# Patient Record
Sex: Male | Born: 1940 | Race: White | Hispanic: No | Smoking: Never smoker
Health system: Southern US, Community
[De-identification: ages and names within clinical notes are randomized; demographics above are authoritative.]

## PROBLEM LIST (undated history)

## (undated) DIAGNOSIS — G459 Transient cerebral ischemic attack, unspecified: Secondary | ICD-10-CM

## (undated) DIAGNOSIS — N200 Calculus of kidney: Secondary | ICD-10-CM

## (undated) DIAGNOSIS — M545 Low back pain, unspecified: Secondary | ICD-10-CM

## (undated) DIAGNOSIS — E7800 Pure hypercholesterolemia, unspecified: Secondary | ICD-10-CM

## (undated) DIAGNOSIS — I4510 Unspecified right bundle-branch block: Secondary | ICD-10-CM

## (undated) DIAGNOSIS — E119 Type 2 diabetes mellitus without complications: Secondary | ICD-10-CM

## (undated) DIAGNOSIS — I2510 Atherosclerotic heart disease of native coronary artery without angina pectoris: Secondary | ICD-10-CM

## (undated) DIAGNOSIS — I10 Essential (primary) hypertension: Secondary | ICD-10-CM

## (undated) DIAGNOSIS — R011 Cardiac murmur, unspecified: Secondary | ICD-10-CM

## (undated) DIAGNOSIS — G8929 Other chronic pain: Secondary | ICD-10-CM

## (undated) HISTORY — PX: JOINT REPLACEMENT: SHX530

## (undated) HISTORY — PX: PARATHYROIDECTOMY: SHX19

## (undated) HISTORY — PX: BACK SURGERY: SHX140

## (undated) HISTORY — PX: CORONARY ANGIOPLASTY WITH STENT PLACEMENT: SHX49

## (undated) HISTORY — PX: CARDIAC CATHETERIZATION: SHX172

## (undated) HISTORY — PX: SHOULDER OPEN ROTATOR CUFF REPAIR: SHX2407

## (undated) HISTORY — PX: POSTERIOR FUSION LUMBAR SPINE: SUR632

## (undated) HISTORY — PX: LITHOTRIPSY: SUR834

## (undated) HISTORY — PX: THYROIDECTOMY, PARTIAL: SHX18

## (undated) HISTORY — PX: LUMBAR DISC SURGERY: SHX700

## (undated) HISTORY — PX: EXCISIONAL HEMORRHOIDECTOMY: SHX1541

---

## 2014-04-30 ENCOUNTER — Encounter (HOSPITAL_COMMUNITY)
Admission: RE | Admit: 2014-04-30 | Discharge: 2014-04-30 | Disposition: A | Discharge status: Home / Self Care | Payer: Worker's Compensation | Source: Ambulatory Visit | Attending: Orthopedic Surgery | Admitting: Orthopedic Surgery

## 2014-04-30 ENCOUNTER — Encounter (HOSPITAL_COMMUNITY): Payer: Self-pay

## 2014-04-30 DIAGNOSIS — E119 Type 2 diabetes mellitus without complications: Secondary | ICD-10-CM | POA: Diagnosis not present

## 2014-04-30 DIAGNOSIS — Z01812 Encounter for preprocedural laboratory examination: Secondary | ICD-10-CM | POA: Insufficient documentation

## 2014-04-30 DIAGNOSIS — I2510 Atherosclerotic heart disease of native coronary artery without angina pectoris: Secondary | ICD-10-CM | POA: Diagnosis not present

## 2014-04-30 DIAGNOSIS — Z955 Presence of coronary angioplasty implant and graft: Secondary | ICD-10-CM | POA: Insufficient documentation

## 2014-04-30 HISTORY — DX: Unspecified right bundle-branch block: I45.10

## 2014-04-30 HISTORY — DX: Cardiac murmur, unspecified: R01.1

## 2014-04-30 HISTORY — DX: Type 2 diabetes mellitus without complications: E11.9

## 2014-04-30 HISTORY — DX: Essential (primary) hypertension: I10

## 2014-04-30 HISTORY — DX: Atherosclerotic heart disease of native coronary artery without angina pectoris: I25.10

## 2014-04-30 HISTORY — DX: Transient cerebral ischemic attack, unspecified: G45.9

## 2014-04-30 LAB — BASIC METABOLIC PANEL
Anion gap: 5 (ref 5–15)
BUN: 12 mg/dL (ref 6–23)
CO2: 28 mmol/L (ref 19–32)
Calcium: 9.1 mg/dL (ref 8.4–10.5)
Chloride: 104 mmol/L (ref 96–112)
Creatinine, Ser: 0.98 mg/dL (ref 0.50–1.35)
GFR calc Af Amer: >90 mL/min (ref >90)
GFR calc non Af Amer: 79 mL/min — ABNORMAL LOW (ref >90)
Glucose, Bld: 185 mg/dL — ABNORMAL HIGH (ref 70–99)
Potassium: 3.7 mmol/L (ref 3.5–5.1)
Sodium: 137 mmol/L (ref 135–145)

## 2014-04-30 LAB — SURGICAL PCR SCREEN
MRSA, PCR: NEGATIVE
Staphylococcus aureus: NEGATIVE

## 2014-04-30 LAB — CBC
HCT: 42.6 % (ref 39.0–52.0)
Hemoglobin: 14.4 g/dL (ref 13.0–17.0)
MCH: 30.8 pg (ref 26.0–34.0)
MCHC: 33.8 g/dL (ref 30.0–36.0)
MCV: 91 fL (ref 78.0–100.0)
Platelets: 229 10*3/uL (ref 150–400)
RBC: 4.68 MIL/uL (ref 4.22–5.81)
RDW: 13 % (ref 11.5–15.5)
WBC: 8.9 10*3/uL (ref 4.0–10.5)

## 2014-04-30 NOTE — Progress Notes (Signed)
   04/30/14 1537  OBSTRUCTIVE SLEEP APNEA  Have you ever been diagnosed with sleep apnea through a sleep study? No  Do you snore loudly (loud enough to be heard through closed doors)?  0  Do you often feel tired, fatigued, or sleepy during the daytime? 1  Has anyone observed you stop breathing during your sleep? 0  Do you have, or are you being treated for high blood pressure? 1  BMI more than 35 kg/m2? 0  Age over 592 years old? 1  Neck circumference greater than 40 cm/16 inches? 1  Gender: 1  Obstructive Sleep Apnea Score 5

## 2014-04-30 NOTE — Progress Notes (Signed)
Patient denies CP, shob. Dr. Magdalene PatriciaStugart, Caleb Burton is PCP

## 2014-04-30 NOTE — Progress Notes (Signed)
[facility] with [facility] requesting [facility]. Pulled last cardiac OV from Care Everywhere and placed printed copy in chart. Requested EKG image and Echo from Dr. Alene MiresSt. Clair.

## 2014-04-30 NOTE — Pre-Procedure Instructions (Addendum)
Kaleem Sartwell  04/30/2014   Your procedure is scheduled on:  April 8  Report to [facility] Admitting at 12:00 PM.  Call this number if you have problems the morning of surgery: [phone]   Remember:   Do not eat food or drink liquids after midnight.   Take these medicines the morning of surgery with A SIP OF WATER: Metoprolol,    Take 25 units of Lantus the night before surgery instead of the normal dose.   STOP Plavix, Aleve, Aspirin, Fish Oil starting .  Place clean  sheets on your bed the night of your first shower and do not sleep with pets.  Day of Surgery  Do not apply any lotions the morning of surgery.  Please wear clean clothes to the hospital/surgery center.     Please read over the following fact sheets that you were given: Pain Booklet, Coughing and Deep Breathing and Surgical Site Infection Prevention

## 2014-05-01 NOTE — Progress Notes (Signed)
Anesthesia Chart Review:  Patient is a 74 year old male scheduled for left reverse shoulder on 05/10/14 by Dr. Ranell PatrickNorris.  History includes non-smoker, CAD s/p DES to mid LAD 11/11/08, DM2, right BBB, murmur, TIA, HTN, parathyroidectomy and partial thyroidectomy, back surgery. BMI is consistent with mild obesity. PCP is Dr. Simmie Daviesicky Stugart. He was seen by cardiologist Dr. Delsa SaleShannon [place] on 03/12/14. Patient was asymptomatic from a CV standpoint and was exercising > 4 METS.  No further CV testing was recommended prior to this procedure. He was instructed to hold Plavix 5 days prior to surgery.   Meds include ASA, Plavix, Lantus, Lopressor, Fish oil, Zocor.  03/12/14 EKG ([facility]): SB at 54 bpm, LAFB, non-specific ST/T wave abnormality.   12/21/12 Echo ([facility]): Moderate concentric LVH, inferolateral and anterolateral akinesis +/- mild inferior hypokinesis. LVEF is mildly reduced at 50%. Mild grade I/IV diastolic dysfunction; abnormal relaxation pattern of mitral inflow.  Mild AV thickening with mild AR. Mild 1+ MR. Estimation of RVSP is not possible.   11/08/08 Cardiac cath ([facility]; see Care Everywhere): LM free of disease. Medium size LAD. In the proximal mid extent there was a focal 70% lesion in the left anterior descending artery. At this point in the left anterior descending artery, there was a medium-size diagonal, which was 99%/subtotally occluded. Further down the left anterior descending artery there was some mild to moderate 30% disease. The remainder of the left anterior descending artery was free of disease. Diagonal 2, which was small, had a 70% proximal lesion in it. Small to medium size CX with 30-40% proximal mid stenosis. OM1 occluded proximally with left to left collaterals. OM2 occluded proximally. Left to right collaterals filling the distal RCA. Large and dominant RCA that was occluded in its mid segment. Right to right collaterals filling the distal RCA and right PDA. LV normal chamber size.  Posterior basal and inferior wall akinesis. No MR. EF 40%. CT surgery was consulted but felt to be a poor surgical candidate for CABG. He underwent successful stenting of mid LAD (Xience V DES) on 11/11/08. Lesion went from 70% to 0%.   Preoperative labs noted.   If no acute changes then I would anticipate that he could proceed as planned.  Velna Ochsllison Tranice Laduke, PA-C [facility]An[facility]hesiology Phone [phone] 05/01/2014 11:37 AM

## 2014-05-05 NOTE — H&P (Signed)
  Caleb Burton is an 74 y.o. male.    Chief Complaint: left shoulder pain  HPI: Pt is a 74 y.o. male complaining of left shoulder pain for multiple years. Pain had continually increased since the beginning. X-rays in the clinic show end-stage arthritic changes of the left shoulder. Pt has tried various conservative treatments which have failed to alleviate their symptoms, including injections and therapy. Various options are discussed with the patient. Risks, benefits and expectations were discussed with the patient. Patient understand the risks, benefits and expectations and wishes to proceed with surgery.   PCP:  Vianne BullsSTUGART,RICKY H, MD  D/C Plans:  Home   PMH: Past Medical History  Diagnosis Date  . Coronary artery disease   . Type 2 diabetes mellitus   . RBBB (right bundle branch block)   . TIA (transient ischemic attack)     x 2 post surgery  . Hypertension   . Heart murmur   . History of kidney stones     PSH: Past Surgical History  Procedure Laterality Date  . Cardiac catheterization    . Thyroidectomy, partial    . Parathyroidectomy    . Back surgery      discectomy, fusion  . Rotator cuff repair Left   . Lithotripsy    . Coronary angioplasty with stent placement      Social History:  reports that he has never smoked. He has never used smokeless tobacco. He reports that he does not drink alcohol or use illicit drugs.  Allergies:  No Known Allergies  Medications: No current facility-administered medications for this encounter.   Current Outpatient Prescriptions  Medication Sig Dispense Refill  . acetaminophen (TYLENOL) 500 MG tablet Take 500 mg by mouth every 6 (six) hours as needed for mild pain.    Marland Kitchen. aspirin 81 MG tablet Take 81 mg by mouth daily.    . clopidogrel (PLAVIX) 75 MG tablet Take 75 mg by mouth daily.    . furosemide (LASIX) 20 MG tablet Take 20 mg by mouth.    Marland Kitchen. ibuprofen (ADVIL,MOTRIN) 200 MG tablet Take 200 mg by mouth 2 (two) times daily as  needed for moderate pain.    Marland Kitchen. insulin glargine (LANTUS) 100 UNIT/ML injection Inject 50 Units into the skin at bedtime.     . metoprolol tartrate (LOPRESSOR) 25 MG tablet Take 25 mg by mouth 2 (two) times daily.    . Omega-3 Fatty Acids (FISH OIL PO) Take 1 tablet by mouth daily. 360mg     . simvastatin (ZOCOR) 40 MG tablet Take 40 mg by mouth daily.      No results found for this or any previous visit (from the past 48 hour(s)). No results found.  ROS: Pain with rom of the left upper extremity  Physical Exam:  Alert and oriented 74 y.o. male in no acute distress Cranial nerves 2-12 intact Cervical spine: full rom with no tenderness, nv intact distally Chest: active breath sounds bilaterally, no wheeze rhonchi or rales Heart: regular rate and rhythm, no murmur Abd: non tender non distended with active bowel sounds Hip is stable with rom  Left shoulder with moderate loss of rom and strength with ER and IR No rashes or edema  Assessment/Plan Assessment: left rotator cuff insufficiency  Plan: Patient will undergo a left reverse total shoulder [facility]. Risks benefits and expectations were discussed with the patient. Patient understand risks, benefits and expectations and wishes to proceed.

## 2014-05-09 MED ORDER — CHLORHEXIDINE GLUCONATE 4 % EX LIQD
60.0000 mL | Freq: Once | CUTANEOUS | Status: DC
  Filled 2014-05-09: qty 60

## 2014-05-09 MED ORDER — CEFAZOLIN SODIUM-DEXTROSE 2-3 GM-% IV SOLR
2.0000 g | INTRAVENOUS | Status: AC
  Administered 2014-05-10: 2 g via INTRAVENOUS
  Filled 2014-05-09: qty 50

## 2014-05-10 ENCOUNTER — Ambulatory Visit (HOSPITAL_COMMUNITY): Payer: Worker's Compensation | Admitting: Anesthesiology

## 2014-05-10 ENCOUNTER — Inpatient Hospital Stay (HOSPITAL_COMMUNITY): Payer: Worker's Compensation

## 2014-05-10 ENCOUNTER — Inpatient Hospital Stay (HOSPITAL_COMMUNITY)
Admission: RE | Admit: 2014-05-10 | Discharge: 2014-05-12 | DRG: 483 | Disposition: A | Discharge status: Home / Self Care | Payer: Worker's Compensation | Source: Ambulatory Visit | Attending: Orthopedic Surgery | Admitting: Orthopedic Surgery

## 2014-05-10 ENCOUNTER — Ambulatory Visit (HOSPITAL_COMMUNITY): Payer: Worker's Compensation | Admitting: Vascular Surgery

## 2014-05-10 ENCOUNTER — Encounter (HOSPITAL_COMMUNITY)
Admission: RE | Disposition: A | Discharge status: Home / Self Care | Payer: Self-pay | Source: Ambulatory Visit | Attending: Orthopedic Surgery

## 2014-05-10 ENCOUNTER — Encounter (HOSPITAL_COMMUNITY): Payer: Self-pay | Admitting: *Deleted

## 2014-05-10 DIAGNOSIS — Z7982 Long term (current) use of aspirin: Secondary | ICD-10-CM

## 2014-05-10 DIAGNOSIS — Z96619 Presence of unspecified artificial shoulder joint: Secondary | ICD-10-CM

## 2014-05-10 DIAGNOSIS — G8929 Other chronic pain: Secondary | ICD-10-CM | POA: Diagnosis present

## 2014-05-10 DIAGNOSIS — I4510 Unspecified right bundle-branch block: Secondary | ICD-10-CM | POA: Diagnosis present

## 2014-05-10 DIAGNOSIS — M75102 Unspecified rotator cuff tear or rupture of left shoulder, not specified as traumatic: Principal | ICD-10-CM | POA: Diagnosis present

## 2014-05-10 DIAGNOSIS — E892 Postprocedural hypoparathyroidism: Secondary | ICD-10-CM | POA: Diagnosis present

## 2014-05-10 DIAGNOSIS — Z87442 Personal history of urinary calculi: Secondary | ICD-10-CM

## 2014-05-10 DIAGNOSIS — R011 Cardiac murmur, unspecified: Secondary | ICD-10-CM | POA: Diagnosis present

## 2014-05-10 DIAGNOSIS — I10 Essential (primary) hypertension: Secondary | ICD-10-CM | POA: Diagnosis present

## 2014-05-10 DIAGNOSIS — Z955 Presence of coronary angioplasty implant and graft: Secondary | ICD-10-CM | POA: Diagnosis not present

## 2014-05-10 DIAGNOSIS — Z79899 Other long term (current) drug therapy: Secondary | ICD-10-CM

## 2014-05-10 DIAGNOSIS — Z8673 Personal history of transient ischemic attack (TIA), and cerebral infarction without residual deficits: Secondary | ICD-10-CM | POA: Diagnosis not present

## 2014-05-10 DIAGNOSIS — Z7902 Long term (current) use of antithrombotics/antiplatelets: Secondary | ICD-10-CM

## 2014-05-10 DIAGNOSIS — Z981 Arthrodesis status: Secondary | ICD-10-CM

## 2014-05-10 DIAGNOSIS — E119 Type 2 diabetes mellitus without complications: Secondary | ICD-10-CM | POA: Diagnosis present

## 2014-05-10 DIAGNOSIS — I2510 Atherosclerotic heart disease of native coronary artery without angina pectoris: Secondary | ICD-10-CM | POA: Diagnosis present

## 2014-05-10 DIAGNOSIS — Z794 Long term (current) use of insulin: Secondary | ICD-10-CM

## 2014-05-10 DIAGNOSIS — Z96612 Presence of left artificial shoulder joint: Secondary | ICD-10-CM

## 2014-05-10 DIAGNOSIS — E780 Pure hypercholesterolemia: Secondary | ICD-10-CM | POA: Diagnosis present

## 2014-05-10 HISTORY — DX: Low back pain, unspecified: M54.50

## 2014-05-10 HISTORY — DX: Calculus of kidney: N20.0

## 2014-05-10 HISTORY — PX: REVERSE SHOULDER ARTHROPLASTY: SHX5054

## 2014-05-10 HISTORY — PX: REVERSE TOTAL SHOULDER ARTHROPLASTY: SHX2344

## 2014-05-10 HISTORY — DX: Pure hypercholesterolemia, unspecified: E78.00

## 2014-05-10 HISTORY — DX: Other chronic pain: G89.29

## 2014-05-10 HISTORY — DX: Low back pain: M54.5

## 2014-05-10 LAB — ABO/RH: ABO/RH(D): O NEG

## 2014-05-10 LAB — TYPE AND SCREEN
ABO/RH(D): O NEG
Antibody Screen: NEGATIVE

## 2014-05-10 LAB — GLUCOSE, CAPILLARY: GLUCOSE-CAPILLARY: 118 mg/dL — AB (ref 70–99)

## 2014-05-10 SURGERY — ARTHROPLASTY, SHOULDER, TOTAL, REVERSE
Anesthesia: General | Site: Shoulder | Laterality: Left

## 2014-05-10 MED ORDER — METHOCARBAMOL 500 MG PO TABS: 500.0000 mg | ORAL_TABLET | Freq: Three times a day (TID) | ORAL | Status: AC | PRN

## 2014-05-10 MED ORDER — MIDAZOLAM HCL 2 MG/2ML IJ SOLN
INTRAMUSCULAR | Status: AC
  Filled 2014-05-10: qty 2

## 2014-05-10 MED ORDER — SIMVASTATIN 40 MG PO TABS
40.0000 mg | ORAL_TABLET | Freq: Every day | ORAL | Status: DC
  Administered 2014-05-10 – 2014-05-11 (×2): 40 mg via ORAL
  Filled 2014-05-10 (×2): qty 1

## 2014-05-10 MED ORDER — OMEGA-3-ACID ETHYL ESTERS 1 G PO CAPS
2.0000 g | ORAL_CAPSULE | Freq: Every day | ORAL | Status: DC
  Administered 2014-05-10 – 2014-05-12 (×3): 2 g via ORAL
  Filled 2014-05-10 (×4): qty 2

## 2014-05-10 MED ORDER — CLOPIDOGREL BISULFATE 75 MG PO TABS
75.0000 mg | ORAL_TABLET | Freq: Every day | ORAL | Status: DC
  Administered 2014-05-10 – 2014-05-12 (×3): 75 mg via ORAL
  Filled 2014-05-10 (×3): qty 1

## 2014-05-10 MED ORDER — ONDANSETRON HCL 4 MG/2ML IJ SOLN
INTRAMUSCULAR | Status: AC
  Filled 2014-05-10: qty 2

## 2014-05-10 MED ORDER — METOCLOPRAMIDE HCL 5 MG PO TABS
5.0000 mg | ORAL_TABLET | Freq: Three times a day (TID) | ORAL | Status: DC | PRN
Start: 2014-05-10 — End: 2014-05-12

## 2014-05-10 MED ORDER — CEFAZOLIN SODIUM-DEXTROSE 2-3 GM-% IV SOLR
2.0000 g | Freq: Four times a day (QID) | INTRAVENOUS | Status: AC
  Administered 2014-05-10 – 2014-05-11 (×3): 2 g via INTRAVENOUS
  Filled 2014-05-10 (×4): qty 50

## 2014-05-10 MED ORDER — SODIUM CHLORIDE 0.9 % IV SOLN
INTRAVENOUS | Status: DC
  Administered 2014-05-10: 18:00:00 via INTRAVENOUS

## 2014-05-10 MED ORDER — GLYCOPYRROLATE 0.2 MG/ML IJ SOLN
INTRAMUSCULAR | Status: AC
  Filled 2014-05-10: qty 1

## 2014-05-10 MED ORDER — FENTANYL CITRATE 0.05 MG/ML IJ SOLN: 50.0000 ug | INTRAMUSCULAR | Status: DC | PRN

## 2014-05-10 MED ORDER — FENTANYL CITRATE 0.05 MG/ML IJ SOLN
INTRAMUSCULAR | Status: DC | PRN
  Administered 2014-05-10: 50 ug via INTRAVENOUS

## 2014-05-10 MED ORDER — BUPIVACAINE-EPINEPHRINE 0.25% -1:200000 IJ SOLN
INTRAMUSCULAR | Status: DC | PRN
  Administered 2014-05-10: 8 mL

## 2014-05-10 MED ORDER — INSULIN GLARGINE 100 UNIT/ML ~~LOC~~ SOLN
50.0000 [IU] | Freq: Every day | SUBCUTANEOUS | Status: DC
  Administered 2014-05-10 – 2014-05-11 (×2): 50 [IU] via SUBCUTANEOUS
  Filled 2014-05-10 (×4): qty 0.5

## 2014-05-10 MED ORDER — SODIUM CHLORIDE 0.9 % IR SOLN
Status: DC | PRN
Start: 2014-05-10 — End: 2014-05-10
  Administered 2014-05-10: 1000 mL

## 2014-05-10 MED ORDER — POLYETHYLENE GLYCOL 3350 17 G PO PACK: 17.0000 g | PACK | Freq: Every day | ORAL | Status: DC | PRN

## 2014-05-10 MED ORDER — ROCURONIUM BROMIDE 100 MG/10ML IV SOLN
INTRAVENOUS | Status: DC | PRN
  Administered 2014-05-10: 40 mg via INTRAVENOUS

## 2014-05-10 MED ORDER — LIDOCAINE HCL (CARDIAC) 20 MG/ML IV SOLN
INTRAVENOUS | Status: AC
  Filled 2014-05-10: qty 5

## 2014-05-10 MED ORDER — METHOCARBAMOL 1000 MG/10ML IJ SOLN
500.0000 mg | Freq: Four times a day (QID) | INTRAVENOUS | Status: DC | PRN
  Administered 2014-05-11: 500 mg via INTRAVENOUS
  Filled 2014-05-10 (×3): qty 5

## 2014-05-10 MED ORDER — ACETAMINOPHEN 325 MG PO TABS: 650.0000 mg | ORAL_TABLET | Freq: Four times a day (QID) | ORAL | Status: DC | PRN

## 2014-05-10 MED ORDER — METHOCARBAMOL 500 MG PO TABS
500.0000 mg | ORAL_TABLET | Freq: Four times a day (QID) | ORAL | Status: DC | PRN
  Administered 2014-05-10 – 2014-05-12 (×3): 500 mg via ORAL
  Filled 2014-05-10 (×3): qty 1

## 2014-05-10 MED ORDER — LACTATED RINGERS IV SOLN
INTRAVENOUS | Status: DC
  Administered 2014-05-10 (×2): via INTRAVENOUS

## 2014-05-10 MED ORDER — BISACODYL 10 MG RE SUPP
10.0000 mg | Freq: Every day | RECTAL | Status: DC | PRN
Start: 2014-05-10 — End: 2014-05-12

## 2014-05-10 MED ORDER — FENTANYL CITRATE 0.05 MG/ML IJ SOLN
INTRAMUSCULAR | Status: AC
  Filled 2014-05-10: qty 5

## 2014-05-10 MED ORDER — ROCURONIUM BROMIDE 50 MG/5ML IV SOLN
INTRAVENOUS | Status: AC
  Filled 2014-05-10: qty 1

## 2014-05-10 MED ORDER — ONDANSETRON HCL 4 MG/2ML IJ SOLN
INTRAMUSCULAR | Status: DC | PRN
  Administered 2014-05-10: 4 mg via INTRAVENOUS

## 2014-05-10 MED ORDER — HYDROMORPHONE HCL 1 MG/ML IJ SOLN
0.5000 mg | INTRAMUSCULAR | Status: DC | PRN
  Administered 2014-05-10 – 2014-05-11 (×3): 0.5 mg via INTRAVENOUS
  Filled 2014-05-10 (×3): qty 1

## 2014-05-10 MED ORDER — OXYCODONE-ACETAMINOPHEN 5-325 MG PO TABS: 1.0000 | ORAL_TABLET | ORAL | Status: AC | PRN

## 2014-05-10 MED ORDER — PROPOFOL 10 MG/ML IV BOLUS
INTRAVENOUS | Status: AC
  Filled 2014-05-10: qty 20

## 2014-05-10 MED ORDER — MENTHOL 3 MG MT LOZG: 1.0000 | LOZENGE | OROMUCOSAL | Status: DC | PRN

## 2014-05-10 MED ORDER — ONDANSETRON HCL 4 MG PO TABS: 4.0000 mg | ORAL_TABLET | Freq: Four times a day (QID) | ORAL | Status: DC | PRN

## 2014-05-10 MED ORDER — ONDANSETRON HCL 4 MG/2ML IJ SOLN: 4.0000 mg | Freq: Four times a day (QID) | INTRAMUSCULAR | Status: DC | PRN

## 2014-05-10 MED ORDER — OXYCODONE HCL 5 MG PO TABS
5.0000 mg | ORAL_TABLET | ORAL | Status: DC | PRN
  Administered 2014-05-10 – 2014-05-12 (×4): 10 mg via ORAL
  Filled 2014-05-10 (×4): qty 2

## 2014-05-10 MED ORDER — NEOSTIGMINE METHYLSULFATE 10 MG/10ML IV SOLN
INTRAVENOUS | Status: DC | PRN
  Administered 2014-05-10: 3 mg via INTRAVENOUS

## 2014-05-10 MED ORDER — FENTANYL CITRATE 0.05 MG/ML IJ SOLN
INTRAMUSCULAR | Status: AC
  Filled 2014-05-10: qty 2

## 2014-05-10 MED ORDER — GLYCOPYRROLATE 0.2 MG/ML IJ SOLN
INTRAMUSCULAR | Status: AC
  Filled 2014-05-10: qty 2

## 2014-05-10 MED ORDER — GLYCOPYRROLATE 0.2 MG/ML IJ SOLN
INTRAMUSCULAR | Status: DC | PRN
  Administered 2014-05-10: 0.4 mg via INTRAVENOUS
  Administered 2014-05-10: 0.2 mg via INTRAVENOUS

## 2014-05-10 MED ORDER — PROPOFOL 10 MG/ML IV BOLUS
INTRAVENOUS | Status: DC | PRN
  Administered 2014-05-10: 90 mg via INTRAVENOUS

## 2014-05-10 MED ORDER — HYDROMORPHONE HCL 1 MG/ML IJ SOLN: 0.2500 mg | INTRAMUSCULAR | Status: DC | PRN

## 2014-05-10 MED ORDER — PROMETHAZINE HCL 25 MG/ML IJ SOLN
6.2500 mg | INTRAMUSCULAR | Status: DC | PRN
Start: 2014-05-10 — End: 2014-05-10

## 2014-05-10 MED ORDER — ACETAMINOPHEN 650 MG RE SUPP: 650.0000 mg | Freq: Four times a day (QID) | RECTAL | Status: DC | PRN

## 2014-05-10 MED ORDER — METOPROLOL TARTRATE 25 MG PO TABS
25.0000 mg | ORAL_TABLET | Freq: Two times a day (BID) | ORAL | Status: DC
  Administered 2014-05-10 – 2014-05-12 (×4): 25 mg via ORAL
  Filled 2014-05-10 (×4): qty 1

## 2014-05-10 MED ORDER — MIDAZOLAM HCL 2 MG/2ML IJ SOLN: 1.0000 mg | INTRAMUSCULAR | Status: DC | PRN

## 2014-05-10 MED ORDER — BUPIVACAINE-EPINEPHRINE (PF) 0.25% -1:200000 IJ SOLN
INTRAMUSCULAR | Status: AC
  Filled 2014-05-10: qty 30

## 2014-05-10 MED ORDER — LIDOCAINE HCL (CARDIAC) 20 MG/ML IV SOLN
INTRAVENOUS | Status: DC | PRN
  Administered 2014-05-10: 80 mg via INTRAVENOUS

## 2014-05-10 MED ORDER — PHENOL 1.4 % MT LIQD: 1.0000 | OROMUCOSAL | Status: DC | PRN

## 2014-05-10 MED ORDER — FUROSEMIDE 20 MG PO TABS
20.0000 mg | ORAL_TABLET | Freq: Every day | ORAL | Status: DC
  Administered 2014-05-10 – 2014-05-12 (×3): 20 mg via ORAL
  Filled 2014-05-10 (×3): qty 1

## 2014-05-10 MED ORDER — ACETAMINOPHEN 500 MG PO TABS: 500.0000 mg | ORAL_TABLET | Freq: Four times a day (QID) | ORAL | Status: DC | PRN

## 2014-05-10 MED ORDER — METOCLOPRAMIDE HCL 5 MG/ML IJ SOLN: 5.0000 mg | Freq: Three times a day (TID) | INTRAMUSCULAR | Status: DC | PRN

## 2014-05-10 MED ORDER — ASPIRIN EC 81 MG PO TBEC
81.0000 mg | DELAYED_RELEASE_TABLET | Freq: Every day | ORAL | Status: DC
  Administered 2014-05-10 – 2014-05-12 (×3): 81 mg via ORAL
  Filled 2014-05-10 (×5): qty 1

## 2014-05-10 SURGICAL SUPPLY — 61 items
BIT DRILL 170X2.5X (BIT) IMPLANT
BIT DRILL 5/64X5 DISP (BIT) ×3 IMPLANT
BIT DRL 170X2.5X (BIT)
BLADE SAG 18X100X1.27 (BLADE) ×3 IMPLANT
CAPT SHLDR REVTOTAL 1 ×3 IMPLANT
CLOSURE WOUND 1/2 X4 (GAUZE/BANDAGES/DRESSINGS) ×1
COVER SURGICAL LIGHT HANDLE (MISCELLANEOUS) ×3 IMPLANT
DRAPE INCISE IOBAN 66X45 STRL (DRAPES) ×3 IMPLANT
DRAPE U-SHAPE 47X51 STRL (DRAPES) ×3 IMPLANT
DRAPE X-RAY CASS 24X20 (DRAPES) IMPLANT
DRILL 2.5 (BIT)
DRSG ADAPTIC 3X8 NADH LF (GAUZE/BANDAGES/DRESSINGS) ×3 IMPLANT
DRSG PAD ABDOMINAL 8X10 ST (GAUZE/BANDAGES/DRESSINGS) ×3 IMPLANT
DURAPREP 26ML APPLICATOR (WOUND CARE) ×3 IMPLANT
ELECT BLADE 4.0 EZ CLEAN MEGAD (MISCELLANEOUS) ×3
ELECT NEEDLE TIP 2.8 STRL (NEEDLE) ×3 IMPLANT
ELECT REM PT RETURN 9FT ADLT (ELECTROSURGICAL) ×3
ELECTRODE BLDE 4.0 EZ CLN MEGD (MISCELLANEOUS) ×1 IMPLANT
ELECTRODE REM PT RTRN 9FT ADLT (ELECTROSURGICAL) ×1 IMPLANT
GAUZE SPONGE 4X4 12PLY STRL (GAUZE/BANDAGES/DRESSINGS) ×3 IMPLANT
GLOVE BIOGEL PI ORTHO PRO 7.5 (GLOVE) ×2
GLOVE BIOGEL PI ORTHO PRO SZ8 (GLOVE) ×2
GLOVE ORTHO TXT STRL SZ7.5 (GLOVE) ×3 IMPLANT
GLOVE PI ORTHO PRO STRL 7.5 (GLOVE) ×1 IMPLANT
GLOVE PI ORTHO PRO STRL SZ8 (GLOVE) ×1 IMPLANT
GLOVE SURG ORTHO 8.5 STRL (GLOVE) ×3 IMPLANT
GOWN STRL REUS W/ TWL LRG LVL3 (GOWN DISPOSABLE) ×1 IMPLANT
GOWN STRL REUS W/ TWL XL LVL3 (GOWN DISPOSABLE) ×2 IMPLANT
GOWN STRL REUS W/TWL LRG LVL3 (GOWN DISPOSABLE) ×2
GOWN STRL REUS W/TWL XL LVL3 (GOWN DISPOSABLE) ×4
HANDPIECE INTERPULSE COAX TIP (DISPOSABLE)
KIT BASIN OR (CUSTOM PROCEDURE TRAY) ×3 IMPLANT
KIT ROOM TURNOVER OR (KITS) ×3 IMPLANT
MANIFOLD NEPTUNE II (INSTRUMENTS) ×3 IMPLANT
NEEDLE 1/2 CIR MAYO (NEEDLE) ×3 IMPLANT
NEEDLE HYPO 25GX1X1/2 BEV (NEEDLE) ×3 IMPLANT
NS IRRIG 1000ML POUR BTL (IV SOLUTION) ×6 IMPLANT
PACK SHOULDER (CUSTOM PROCEDURE TRAY) ×3 IMPLANT
PAD ARMBOARD 7.5X6 YLW CONV (MISCELLANEOUS) ×6 IMPLANT
PIN GUIDE 1.2 (PIN) IMPLANT
PIN GUIDE GLENOPHERE 1.5MX300M (PIN) IMPLANT
PIN METAGLENE 2.5 (PIN) IMPLANT
SET HNDPC FAN SPRY TIP SCT (DISPOSABLE) IMPLANT
SLING ARM LRG ADULT FOAM STRAP (SOFTGOODS) ×3 IMPLANT
SLING ARM MED ADULT FOAM STRAP (SOFTGOODS) IMPLANT
SPONGE LAP 18X18 X RAY DECT (DISPOSABLE) ×3 IMPLANT
SPONGE LAP 4X18 X RAY DECT (DISPOSABLE) ×3 IMPLANT
STRIP CLOSURE SKIN 1/2X4 (GAUZE/BANDAGES/DRESSINGS) ×2 IMPLANT
SUCTION FRAZIER TIP 10 FR DISP (SUCTIONS) ×3 IMPLANT
SUT FIBERWIRE #2 38 T-5 BLUE (SUTURE) ×12
SUT MNCRL AB 4-0 PS2 18 (SUTURE) ×3 IMPLANT
SUT VIC AB 2-0 CT1 27 (SUTURE) ×2
SUT VIC AB 2-0 CT1 TAPERPNT 27 (SUTURE) ×1 IMPLANT
SUTURE FIBERWR #2 38 T-5 BLUE (SUTURE) ×4 IMPLANT
SYR CONTROL 10ML LL (SYRINGE) ×3 IMPLANT
TOWEL OR 17X24 6PK STRL BLUE (TOWEL DISPOSABLE) ×3 IMPLANT
TOWEL OR 17X26 10 PK STRL BLUE (TOWEL DISPOSABLE) ×3 IMPLANT
TOWER CARTRIDGE SMART MIX (DISPOSABLE) IMPLANT
TRAY FOLEY CATH 16FRSI W/METER (SET/KITS/TRAYS/PACK) IMPLANT
WATER STERILE IRR 1000ML POUR (IV SOLUTION) ×3 IMPLANT
YANKAUER SUCT BULB TIP NO VENT (SUCTIONS) ×3 IMPLANT

## 2014-05-10 NOTE — Progress Notes (Signed)
Michelle NasutiElena, CRNA assisted with block but used IV Fentanyl and Versed removed from pyxis by this nurse. This nurse wasted unused amount with Jimmye NormanBeth Elliott, RN.

## 2014-05-10 NOTE — Transfer of Care (Signed)
Immediate Anesthesia Transfer of Care Note  Patient: Caleb Burton  Procedure(s) Performed: Procedure(s): LEFT SHOULDER REVERSE  (Left)  Patient Location: PACU  Anesthesia Type:General  Level of Consciousness: awake, alert  and oriented  Airway & Oxygen Therapy: Patient Spontanous Breathing and Patient connected to nasal cannula oxygen  Post-op Assessment: Report given to RN, Post -op Vital signs reviewed and stable and Patient moving all extremities  Post vital signs: Reviewed and stable  Last Vitals:  Filed Vitals:   05/10/14 1151  BP: 144/77  Pulse: 60  Temp: 36.9 C  Resp: 18    Complications: No apparent anesthesia complications

## 2014-05-10 NOTE — Discharge Instructions (Signed)
Ice to the shoulder as much as possible, keep the incision covered and clean and dry, ok to get wet in the shower in one week   Do not push, pull, or lift with the left arm.  NO DRIVING until follow up with Dr Ranell PatrickNorris  No pushing out of a chair.  Do exercises every hour while awake, pendulums, lap slides, wrist and elbow ROM  Gentle ADLs ok.  May remove the sling in the home while seated or in the recliner,  Keep a pillow propped behind the left arm to keep the arm across your waist  Follow up with Dr  Ranell PatrickNorris in two weeks  [phone]

## 2014-05-10 NOTE — Interval H&P Note (Signed)
History and Physical Interval Note:  05/10/2014 12:10 PM  Caleb Burton  has presented today for surgery, with the diagnosis of LEFT SHOULDER ROTATOR CUFF TEAR  The various methods of treatment have been discussed with the patient and family. After consideration of risks, benefits and other options for treatment, the patient has consented to  Procedure(s): LEFT SHOULDER REVERSE  (Left) as a surgical intervention .  The patient's history has been reviewed, patient examined, no change in status, stable for surgery.  I have reviewed the patient's chart and labs.  Questions were answered to the patient's satisfaction.     Harlym Gehling,STEVEN R

## 2014-05-10 NOTE — Brief Op Note (Signed)
05/10/2014  3:00 PM  PATIENT:  Caleb Burton  74 y.o. male  PRE-OPERATIVE DIAGNOSIS:  LEFT SHOULDER ROTATOR CUFF TEAR ATHROPATHY  POST-OPERATIVE DIAGNOSIS:  LEFT SHOULDER ROTATOR CUFF TEAR ARTHROPATHY  PROCEDURE:  Procedure(s): LEFT SHOULDER REVERSE  (Left) TOTAL SHOULDER ARTHROPLASTY, DePuy Delta Extend  SURGEON:  Surgeon(s) and Role:    * Beverely LowSteve Lionell Matuszak, MD - Primary  PHYSICIAN ASSISTANT:   ASSISTANTS: Thea Gisthomas B Dixon, PA-C   ANESTHESIA:   regional and general  EBL:  Total I/O In: 1000 [I.V.:1000] Out: 150 [Blood:150]  BLOOD ADMINISTERED:none  DRAINS: none   LOCAL MEDICATIONS USED:  MARCAINE     SPECIMEN:  No Specimen  DISPOSITION OF SPECIMEN:  N/A  COUNTS:  YES  TOURNIQUET:  * No tourniquets in log *  DICTATION: .Other Dictation: Dictation Number [id number]  PLAN OF CARE: Admit to inpatient   PATIENT DISPOSITION:  PACU - hemodynamically stable.   Delay start of Pharmacological VTE agent (>24hrs) due to surgical blood loss or risk of bleeding: no

## 2014-05-10 NOTE — Anesthesia Procedure Notes (Signed)
Procedure Name: Intubation Date/Time: 05/10/2014 12:54 PM Performed by: Sharlene DoryWALKER, Thi Sisemore E Pre-anesthesia Checklist: Patient identified, Emergency Drugs available, Suction available, Patient being monitored and Timeout performed Patient Re-evaluated:Patient Re-evaluated prior to inductionOxygen Delivery Method: Circle system utilized Preoxygenation: Pre-oxygenation with 100% oxygen Intubation Type: IV induction Ventilation: Mask ventilation without difficulty Laryngoscope Size: Mac and 3 Grade View: Grade I Tube type: Oral Tube size: 7.5 mm Number of attempts: 1 Airway Equipment and Method: Stylet Placement Confirmation: ETT inserted through vocal cords under direct vision,  positive ETCO2 and breath sounds checked- equal and bilateral Secured at: 22 cm Tube secured with: Tape Dental Injury: Teeth and Oropharynx as per pre-operative assessment

## 2014-05-10 NOTE — Anesthesia Preprocedure Evaluation (Signed)
Anesthesia Evaluation  Patient identified by MRN, date of birth, ID band Patient awake    Reviewed: Allergy & Precautions, NPO status , Patient's Chart, lab work & pertinent test results  Airway Mallampati: II  TM Distance: >3 FB Neck ROM: Full    Dental no notable dental hx.    Pulmonary neg pulmonary ROS,  breath sounds clear to auscultation  Pulmonary exam normal       Cardiovascular hypertension, Pt. on medications and Pt. on home beta blockers + CAD and + Cardiac Stents Rhythm:Regular Rate:Normal     Neuro/Psych TIAnegative psych ROS   GI/Hepatic negative GI ROS, Neg liver ROS,   Endo/Other  diabetes, Insulin Dependent  Renal/GU negative Renal ROS  negative genitourinary   Musculoskeletal negative musculoskeletal ROS (+)   Abdominal   Peds negative pediatric ROS (+)  Hematology negative hematology ROS (+)   Anesthesia Other Findings   Reproductive/Obstetrics negative OB ROS                             Anesthesia Physical Anesthesia Plan  ASA: III  Anesthesia Plan: General   Post-op Pain Management:    Induction: Intravenous  Airway Management Planned: Oral ETT  Additional Equipment:   Intra-op Plan:   Post-operative Plan: Extubation in OR  Informed Consent: I have reviewed the patients History and Physical, chart, labs and discussed the procedure including the risks, benefits and alternatives for the proposed anesthesia with the patient or authorized representative who has indicated his/her understanding and acceptance.   Dental advisory given  Plan Discussed with: CRNA and Surgeon  Anesthesia Plan Comments:         Anesthesia Quick Evaluation

## 2014-05-11 LAB — BASIC METABOLIC PANEL
ANION GAP: 8 (ref 5–15)
BUN: 16 mg/dL (ref 6–23)
CHLORIDE: 103 mmol/L (ref 96–112)
CO2: 24 mmol/L (ref 19–32)
CREATININE: 1.1 mg/dL (ref 0.50–1.35)
Calcium: 8.4 mg/dL (ref 8.4–10.5)
GFR calc non Af Amer: 64 mL/min — ABNORMAL LOW (ref >90)
GFR, EST AFRICAN AMERICAN: 74 mL/min — AB (ref >90)
GLUCOSE: 228 mg/dL — AB (ref 70–99)
POTASSIUM: 4.5 mmol/L (ref 3.5–5.1)
Sodium: 135 mmol/L (ref 135–145)

## 2014-05-11 LAB — HEMOGLOBIN AND HEMATOCRIT, BLOOD
HCT: 42.1 % (ref 39.0–52.0)
HEMOGLOBIN: 13.9 g/dL (ref 13.0–17.0)

## 2014-05-11 MED ORDER — HYDROMORPHONE HCL 1 MG/ML IJ SOLN
1.0000 mg | INTRAMUSCULAR | Status: DC | PRN
  Administered 2014-05-11 (×2): 2 mg via INTRAVENOUS
  Administered 2014-05-11: 1 mg via INTRAVENOUS
  Administered 2014-05-12 (×2): 2 mg via INTRAVENOUS
  Filled 2014-05-11 (×2): qty 2
  Filled 2014-05-11: qty 1
  Filled 2014-05-11 (×2): qty 2

## 2014-05-11 NOTE — Op Note (Signed)
NAMMarland Kitchen:  Malena CatholicRICHARDSON, Muriel           ACCOUNT NO.:  [id number]  MEDICAL RECORD NO.:  [id number]  LOCATION:  [id number]                        FACILITY:  [facility]  PHYSICIAN:  Almedia BallsSteven R. Ranell PatrickNorris, M.D. DATE OF BIRTH:  04-20-1940  DATE OF PROCEDURE:  05/10/2014 DATE OF DISCHARGE:                              OPERATIVE REPORT   PREOPERATIVE DIAGNOSIS:  Left shoulder rotator cuff tear arthropathy.  POSTOPERATIVE DIAGNOSIS:  Left shoulder rotator cuff tear arthropathy.  PROCEDURE PERFORMED:  Left shoulder reverse total shoulder arthroplasty using DePuy Delta XTEND prosthesis.  ATTENDING SURGEON:  Almedia BallsSteven R. Ranell PatrickNorris, MD  ASSISTANT:  Donnie Coffinhomas B. Dixon, PA-C, who was scrubbed during the entire procedure and necessary for satisfactory completion of surgery.  ANESTHESIA:  General anesthesia was used plus interscalene block.  ESTIMATED BLOOD LOSS:  Minimal.  FLUID REPLACEMENT:  1200 mL crystalloids.  INSTRUMENT COUNTS:  Correct.  COMPLICATIONS:  There were no complications.  ANTIBIOTICS:  Perioperative antibiotics were  given.  INDICATIONS:  Patient is a 74 year old male with a history of prior rotator cuff surgery and now profound shoulder dysfunction and pain. 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  X-rays performed demonstrating superior head migration.  The patient presents now for operative treatment for his shoulder planning on reverse shoulder arthroplasty to restore fixed fulcrum mechanics and allow for better function and decrease pain.  Risks and benefits were discussed.  Informed consent obtained.  DESCRIPTION OF PROCEDURE:  After an adequate level of anesthesia was achieved, the patient was positioned in the modified beach-chair position.  Left shoulder correctly identified and sterilely prepped and draped in usual manner.  Time-out was called.  We used a deltopectoral approach starting at coracoid process extending down to the  anterior humerus.  Dissection down through subcutaneous tissues using needle-tip Bovie.  I identified the cephalic vein, took it laterally with the deltoid.  Pectoralis taken medially.  We identified the conjoined tendon, retracted it medially, then released subscapularis off the lesser tuberosity.  This was significantly thinned, but was intact.  We tagged it for repair at the end of the surgery.  Next, we released the inferior capsule off the neck of the humerus.  Lots of joint fluid were noted with a large effusion.  We drained all that and went ahead and continued to release around the inferior humeral neck.  We then noted there to be no supraspinatus, infraspinatus remaining, some teres minor was remaining, but we released the posterior capsule and a little bit at the upper part of the teres just to get better excursion.  We then went into the proximal humerus using a 6 mm reamer, we reamed up to size 12 mm reamer and then did our metaphyseal preparation for the size 2 centered metaphysis, we reamed that out and went ahead and placed our trial in place, so it was a 12 stem with a centered size 2 metaphyseal component set on the 0 degrees in terms of its attachment of the metaphyseal component to the shaft, but we did have that placed in 25 degrees of retroversion.  We then impacted that in position, retracted the humerus posteriorly, did a 360 degree glenoid labrum and capsule removal, careful  to protect the axillary nerve.  Remaining cartilage was removed using a Cobb elevator.  We found the center point for the glenoid and referenced off the inferior pillar.  We drilled a center guide pin and then reamed for the metaglene.  We then drilled our center PEG hole, irrigated thoroughly, impacted the metaglene into position, placed a 48 lock screw inferiorly, 36 lock screw in the base of the coracoid and an 18 nonlocked anteriorly.  We locked those screws into position.  We then placed  42 eccentric glenosphere and careful to protect the axillary nerve, pounded that into place and screwed that. Once we had that in position, we had nice coverage over the inferior glenoid as well as centered in low and tilted inferiorly just a little bit.  Next, we went ahead and trialed with a 42+ 3 trial.  We had a nice tight fit with a nice snap.  No gapping with external rotation.  No negative sulcus and conjoined tendon type.  We irrigated thoroughly.  We then removed the trial components, placed drill holes in the subscapularis footprint in the lesser tuberosity, placed #2 FiberWire suture in his drill holes for repair of the subscapularis.  We then used impaction grafting bone grafting technique with available bone graft from the head to place our stem, this was an HA coated size 12 stem with a size 2 standard metaphysis, secured in place, and placed 25 degrees of retroversion, again impaction grafting utilized.  We had nice secure fit with the sutures already placed in the shaft.  We then placed the 42+ 3 poly, again had nice tight fit, nice stability with a real poly in place, ranged the shoulder, had excellent range of motion.  We did an assessment of the subscapularis and felt we could get repaired and externally rotate at least 30 degrees, so we went ahead, did a primary repair back to bone sutures directly into the bone and had a nice repair of the subscapularis, even though it was thin, definitely felt like it was probably still functional to some extent and thus warranted repair and we went ahead and had a nice repair on it.  We irrigated thoroughly, then closed the deltopectoral interval with 0 Vicryl suture followed by 2-0 Vicryl subcutaneous closure and 4-0 Monocryl for skin.  Steri-Strips applied followed by sterile dressing.  The patient tolerated the surgery well.     Almedia Balls. Ranell Patrick, M.D.     SRN/MEDQ  D:  05/10/2014  T:  05/11/2014  Job:  [id number]

## 2014-05-11 NOTE — Progress Notes (Signed)
   Subjective: 1 Day Post-Op Procedure(s) (LRB): LEFT SHOULDER REVERSE  (Left) Patient reports pain as moderate.   Patient seen in rounds with Dr. Lequita HaltAluisio. Patient is having problems with pain in the shoulder, requiring pain medications We will start therapy today.  Plan is to go Home after hospital stay.  Objective: Vital signs in last 24 hours: Temp:  [97.5 F (36.4 C)-99.8 F (37.7 C)] 99.8 F (37.7 C) (04/09 [id number]) Pulse Rate:  [44-129] 87 (04/09 0613) Resp:  [11-21] 16 (04/09 0613) BP: (114-145)/(57-90) 130/70 mmHg (04/09 0613) SpO2:  [91 %-100 %] 95 % (04/09 0613) Weight:  [100.835 kg (222 lb 4.8 oz)] 100.835 kg (222 lb 4.8 oz) (04/08 1151)  Intake/Output from previous day: 04/08 0701 - 04/09 0700 In: 1565 [P.O.:410; I.V.:1000; IV Piggyback:155] Out: 725 [Urine:575; Blood:150] Intake/Output this shift: Total I/O In: -  Out: 200 [Urine:200]   Recent Labs  05/11/14 0517  HGB 13.9    Recent Labs  05/11/14 0517  HCT 42.1    Recent Labs  05/11/14 0517  NA 135  K 4.5  CL 103  CO2 24  BUN 16  CREATININE 1.10  GLUCOSE 228*  CALCIUM 8.4   No results for input(s): LABPT, INR in the last 72 hours.  EXAM General - Patient is Alert, Appropriate and Oriented Extremity - Neurovascular intact Sensation intact distally Dressing - dressing C/D/I Motor Function - intact, moving hand and fingers well on exam.   Past Medical History  Diagnosis Date  . Coronary artery disease   . RBBB (right bundle branch block)   . TIA (transient ischemic attack)     x 2 post surgery  . Hypertension   . Heart murmur   . Kidney stones   . High cholesterol   . Type 2 diabetes mellitus   . Chronic lower back pain     Assessment/Plan: 1 Day Post-Op Procedure(s) (LRB): LEFT SHOULDER REVERSE  (Left) Active Problems:   S/P shoulder replacement  Estimated body mass index is 32.37 kg/(m^2) as calculated from the following:   Height as of this encounter: 5' 9.5" (1.765  m).   Weight as of this encounter: 100.835 kg (222 lb 4.8 oz). Up with therapy Discharge home with home health  Up ad lib D/C O2 and Pulse OX and try on Room Air  Avel Peacerew Fabiana Dromgoole, PA-C Orthopaedic Surgery 05/11/2014, 9:22 AM

## 2014-05-11 NOTE — Evaluation (Signed)
Occupational Therapy Evaluation Patient Details Name: Caleb Burton MRN: [id number] DOB: [birth year] Today's Date: 05/11/2014    History of Present Illness 74 y.o. s/p left reverse TSA.   Clinical Impression   Pt s/p above. Pt independent with ADLs, PTA. Feel pt will benefit from acute OT to increase independence and address LUE prior to d/c. Recommending someone stay with pt 24/7 at least initially. If this is not possible, [facility] Will plan to see pt tomorrow.    Follow Up Recommendations  No OT follow up;Supervision/Assistance - 24 hour    Equipment Recommendations  Tub/shower seat    Recommendations for Other Services       Precautions / Restrictions Precautions Precautions: Shoulder;Fall Type of Shoulder Precautions: conservative protocol-no ROM of shoulder; AROM elbow, wrist, hand; sling at all times with exception of ADL/exercise; no pushing, pulling, lifting with LUE (can use to hold light items) Shoulder Interventions: Shoulder sling/immobilizer;Off for dressing/bathing/exercises Precaution Booklet Issued: Yes (comment) Precaution Comments: educated on precautions Required Braces or Orthoses: Sling Restrictions Weight Bearing Restrictions: Yes LUE Weight Bearing: Non weight bearing      Mobility Bed Mobility               General bed mobility comments: not assessed  Transfers Overall transfer level: Needs assistance Equipment used: None Transfers: Sit to/from Stand Sit to Stand: Min guard              Balance  Min guard for ambulation.                                          ADL Overall ADL's : Needs assistance/impaired                         Toilet Transfer: Min guard;Ambulation (chair)           Functional mobility during ADLs: Min guard General ADL Comments: Reviewed information on shoulder handout. Recommended someone be with him for shower transfer. Discussed getting shower chair  for him to use for LB bathing-recommended sitting for this.      Vision     Perception     Praxis      Pertinent Vitals/Pain Pain Assessment: 0-10 Pain Score: 5  Pain Location: left shoulder Pain Descriptors / Indicators: Throbbing Pain Intervention(s): Monitored during session;Repositioned     Hand Dominance Right   Extremity/Trunk Assessment Upper Extremity Assessment Upper Extremity Assessment: LUE deficits/detail LUE Deficits / Details: s/p left reverse TSA; able to move elbow, wrist, hand WFL LUE Sensation: decreased light touch   Lower Extremity Assessment Lower Extremity Assessment: Overall WFL for tasks assessed       Communication Communication Communication: No difficulties   Cognition Arousal/Alertness: Lethargic Behavior During Therapy: WFL for tasks assessed/performed Overall Cognitive Status: Within Functional Limits for tasks assessed                     General Comments       Exercises Exercises: Other exercises;Shoulder Other Exercises Other Exercises: Pt performed approximately 10 reps each of left elbow flexion/extension (A/AAROM), wrist flexion/extension (AROM), digit composite flexion/extension (AROM) Other Exercises: pt briefly performed neck ROM   Shoulder Instructions Shoulder Instructions Donning/doffing shirt without moving shoulder:  (educated on technique-suggested button up shirts) Method for sponge bathing under operated UE:  (educated) Donning/doffing sling/immobilizer:  (educated/demonstrated-pt assisted some)  Correct positioning of sling/immobilizer:  (educated/demonstrated) ROM for elbow, wrist and digits of operated UE: Minimal assistance Sling wearing schedule (on at all times/off for ADL's):  (educated) Proper positioning of operated UE when showering:  (educated) Positioning of UE while sleeping:  (educated)    Home Living Family/patient expects to be discharged to:: Private residence Living Arrangements:  Alone Available Help at Discharge: Family;Friend(s);Available PRN/intermittently Type of Home: House Home Access: Stairs to enter [employer] of Steps: 3 Entrance Stairs-Rails: Right;Left;Can reach both Home Layout: One level     Bathroom Shower/Tub: Chief Strategy Officer: Standard (tub close he can push up on)                Prior Functioning/Environment Level of Independence: Independent             OT Diagnosis: Acute pain   OT Problem List: Decreased range of motion;Decreased activity tolerance;Pain;Impaired UE functional use;Decreased knowledge of precautions;Decreased knowledge of use of DME or AE;Impaired sensation   OT Treatment/Interventions: Self-care/ADL training;Therapeutic exercise;DME and/or AE instruction;Therapeutic activities;Patient/family education;Balance training    OT Goals(Current goals can be found in the care plan section) Acute Rehab OT Goals Patient Stated Goal: not stated OT Goal Formulation: With patient (and friend) Time For Goal Achievement: 05/18/14 Potential to Achieve Goals: Good ADL Goals Pt Will Transfer to Toilet: with modified independence;ambulating;regular height toilet;grab bars Additional ADL Goal #1: Pt will independently perform HEP for LUE. Additional ADL Goal #2: Pt/caregiver will be independent with ADLs while maintaining shoulder precautions.  OT Frequency: Min 2X/week   Barriers to D/C:            Co-evaluation              End of Session Equipment Utilized During Treatment: Gait belt;Other (comment) (sling)  Activity Tolerance: Patient tolerated treatment well Patient left: in chair;with family/visitor present   Time: 1610-9604 OT Time Calculation (min): 19 min Charges:  OT General Charges $OT Visit: 1 Procedure OT Evaluation $Initial OT Evaluation Tier I: 1 Procedure G-CodesEarlie Raveling OTR/L [phone] 05/11/2014, 5:10 PM

## 2014-05-12 LAB — GLUCOSE, CAPILLARY: GLUCOSE-CAPILLARY: 164 mg/dL — AB (ref 70–99)

## 2014-05-12 NOTE — Progress Notes (Signed)
   Subjective: 2 Days Post-Op Procedure(s) (LRB): LEFT SHOULDER REVERSE  (Left) Patient reports pain as mild.   Patient seen in rounds with Dr. Lequita HaltAluisio.  Sitting up in bed. Patient is well, but has had some minor complaints of pain in the shoulder, requiring pain medications Patient is ready to go home today  Objective: Vital signs in last 24 hours: Temp:  [98.8 F (37.1 C)-99.7 F (37.6 C)] 98.8 F (37.1 C) (04/10 0500) Pulse Rate:  [62-69] 62 (04/10 0500) Resp:  [17-18] 17 (04/10 0500) BP: (123-137)/(64-70) 123/64 mmHg (04/10 0500) SpO2:  [92 %-94 %] 94 % (04/10 0500)  Intake/Output from previous day:  Intake/Output Summary (Last 24 hours) at 05/12/14 0845 Last data filed at 05/11/14 1842  Gross per 24 hour  Intake    960 ml  Output   1100 ml  Net   -140 ml   Labs:  Recent Labs  05/11/14 0517  HGB 13.9    Recent Labs  05/11/14 0517  HCT 42.1    Recent Labs  05/11/14 0517  NA 135  K 4.5  CL 103  CO2 24  BUN 16  CREATININE 1.10  GLUCOSE 228*  CALCIUM 8.4   No results for input(s): LABPT, INR in the last 72 hours.  EXAM: General - Patient is Alert and Appropriate Extremity - Neurovascular intact Sensation intact distally No cellulitis present Incision - clean, dry, no drainage Motor Function - intact, moving fingers and hand on exam  Assessment/Plan: 2 Days Post-Op Procedure(s) (LRB): LEFT SHOULDER REVERSE  (Left) Procedure(s) (LRB): LEFT SHOULDER REVERSE  (Left) Past Medical History  Diagnosis Date  . Coronary artery disease   . RBBB (right bundle branch block)   . TIA (transient ischemic attack)     x 2 post surgery  . Hypertension   . Heart murmur   . Kidney stones   . High cholesterol   . Type 2 diabetes mellitus   . Chronic lower back pain    Active Problems:   S/P shoulder replacement  Estimated body mass index is 32.37 kg/(m^2) as calculated from the following:   Height as of this encounter: 5' 9.5" (1.765 m).   Weight as  of this encounter: 100.835 kg (222 lb 4.8 oz). Discharge home Diet - Cardiac diet and Diabetic diet Follow up - in 2 weeks with Dr. Ranell PatrickNorris Activity - up ad lib Disposition - Home Condition Upon Discharge - Stable D/C Meds - See DC Summary Sling for arm support  Avel Peacerew Perkins, PA-C Orthopaedic Surgery 05/12/2014, 8:45 AM

## 2014-05-12 NOTE — Progress Notes (Addendum)
Occupational Therapy Treatment Patient Details Name: Caleb Burton MRN: [id number] DOB: December 26, 1940 Today's Date: 05/12/2014    History of present illness 74 y.o. s/p left reverse TSA.   OT comments  Education provided to pt, friend, and his daughter. They are going to try to work out someone being with him at home and OT recommended. Notified nurse to order pt BSC and shower chair.  Follow Up Recommendations  No OT follow up;Supervision/Assistance - 24 hour    Equipment Recommendations  Tub/shower seat;3 in 1 bedside comode    Recommendations for Other Services      Precautions / Restrictions Precautions Precautions: Shoulder;Fall Type of Shoulder Precautions: conservative protocol-no ROM of shoulder; AROM elbow, wrist, hand; sling at all times with exception of ADL/exercise; no pushing, pulling, lifting with LUE (can use to hold light items) Shoulder Interventions: Shoulder sling/immobilizer;Off for dressing/bathing/exercises Precaution Comments: educated on precautions Required Braces or Orthoses: Sling Restrictions Weight Bearing Restrictions: Yes LUE Weight Bearing: Non weight bearing       Mobility Bed Mobility               General bed mobility comments: not assessed  Transfers Overall transfer level: Needs assistance Equipment used: None Transfers: Sit to/from Stand Sit to Stand: Supervision              Balance    Supervision for ambulation.                               ADL Overall ADL's : Needs assistance/impaired                         Toilet Transfer: Supervision/safety;Ambulation;Regular Toilet;Grab bars (pt has tub he can use at home, but it is lower than grab bar was positioned-discussed getting 3 in 1).           Functional mobility during ADLs: Supervision/safety (tried walking with and without cane) General ADL Comments: Discussed getting shower chair and recommended sitting for LB bathing. Educated on 3  in 1. Reviewed information that was on handout given to them yesterday. Discussed putting ice on LUE.      Vision                     Perception     Praxis      Cognition  Awake/Alert Behavior During Therapy: WFL for tasks assessed/performed Overall Cognitive Status: Within Functional Limits for tasks assessed                       Extremity/Trunk Assessment               Exercises/Shoulder Instructions Other Exercises Other Exercises: Performed left elbow flexion/extension, wrist flexion/extension, neck ROM, digit composite flexion/extension, and forearm supination/pronation Donning/doffing shirt without moving shoulder:  (pt able to verbalize technique-OT helped position shirt around left shoulder after exercises-discussed type of shirt to wear) Method for sponge bathing under operated UE:  (educated) Donning/doffing sling/immobilizer:  (educated/demonstrated) Correct positioning of sling/immobilizer:  (OT educated/demonstrated and marked for pt/caregiver) ROM for elbow, wrist and digits of operated UE: Minimal assistance Sling wearing schedule (on at all times/off for ADL's):  (educated) Proper positioning of operated UE when showering:  (educated) Positioning of UE while sleeping: Patient able to independently direct caregiver (pt able to verbalize)      General Comments      Pertinent Vitals/ Pain  Pain Assessment: 0-10 Pain Score: 4  Pain Location: left hand/thumb area Pain Descriptors / Indicators: Other (Comment) (stiffness) Pain Intervention(s): Monitored during session  Home Living                                          Prior Functioning/Environment              Frequency Min 2X/week     Progress Toward Goals  OT Goals(current goals can now be found in the care plan section)  Progress towards OT goals: Progressing toward goals  Acute Rehab OT Goals Patient Stated Goal: go home OT Goal Formulation:  With patient (and friend) Time For Goal Achievement: 05/18/14 Potential to Achieve Goals: Good ADL Goals Pt Will Transfer to Toilet: with modified independence;ambulating;regular height toilet;grab bars Additional ADL Goal #1: Pt will independently perform HEP for LUE. Additional ADL Goal #2: Pt/caregiver will be independent with ADLs while maintaining shoulder precautions.  Plan Discharge plan remains appropriate    Co-evaluation                 End of Session Equipment Utilized During Treatment: Other (comment) (sling)   Activity Tolerance Patient tolerated treatment well   Patient Left in chair;with family/visitor present   Nurse Communication Other (comment) (recommending shower chair and 3 in 1)        Time: 8119-14781021-1043 OT Time Calculation (min): 22 min  Charges: OT General Charges $OT Visit: 1 Procedure OT Treatments $Self Care/Home Management : 8-22 mins  Earlie RavelingStraub, [name] L OTR/L [phone] 05/12/2014, 11:09 AM

## 2014-05-12 NOTE — Progress Notes (Signed)
Discharge instructions gone over with patient and family. Home medications gone over. Prescriptions given. Follow up appointment is made. Incisional care, exercises, and signs and symptoms of infection gone over. My chart discussed. Patient verbalized understanding of instructions.

## 2014-05-12 NOTE — Care Management Note (Unsigned)
    Page 1 of 1   05/12/2014     4:49:08 PM CARE MANAGEMENT NOTE 05/12/2014  Patient:  Caleb Burton, Caleb Burton   Account Number:  [id number]  Date Initiated:  05/12/2014  Documentation initiated by:  [facility]  Subjective/Objective Assessment:   adm: LEFT SHOULDER REVERSE  (Left)     Action/Plan:   discharge planning   Anticipated DC Date:  05/12/2014   Anticipated DC Plan:  [place]  CM consult      Choice offered to / List presented to:     DME arranged  3-N-1  SHOWER STOOL           Status of service:  In process, will continue to follow Medicare Important Message given?   (If response is "NO", the following Medicare IM given date fields will be blank) Date Medicare IM given:   Medicare IM given by:   Date Additional Medicare IM given:   Additional Medicare IM given by:    Discharge Disposition:  HOME/SELF CARE  Per UR Regulation:    If discussed at Long Length of Stay Meetings, dates discussed:    Comments:  05/12/14 10:50 CM met with pt but he had no local contact Workers Comp CM.  Pt has been ordered DME: 3n1 and shower stool.  CM has facesheet, orders, and eval for CM who comes in on Monday 05/13/14 to contact [facility] when it is open (not open on the weekend) to arrange for shipment of DME to home of pt.  No other CM needs were communicated. Mariane Masters, BSN, CM [phone].

## 2014-05-13 NOTE — Anesthesia Postprocedure Evaluation (Signed)
  Anesthesia Post-op Note  Patient: Caleb Burton  Procedure(s) Performed: Procedure(s) (LRB): LEFT SHOULDER REVERSE  (Left)  Patient Location: PACU  Anesthesia Type: GA combined with regional for post-op pain  Level of Consciousness: awake and alert   Airway and Oxygen Therapy: Patient Spontanous Breathing  Post-op Pain: mild  Post-op Assessment: Post-op Vital signs reviewed, Patient's Cardiovascular Status Stable, Respiratory Function Stable, Patent Airway and No signs of Nausea or vomiting  Last Vitals:  Filed Vitals:   05/12/14 0500  BP: 123/64  Pulse: 62  Temp: 37.1 C  Resp: 17    Post-op Vital Signs: stable   Complications: No apparent anesthesia complications

## 2014-05-14 ENCOUNTER — Encounter (HOSPITAL_COMMUNITY): Payer: Self-pay | Admitting: Orthopedic Surgery

## 2014-05-16 NOTE — Discharge Summary (Signed)
Physician Discharge Summary   Patient ID: Caleb Burton MRN: [id number] DOB/AGE: [birth year] 75 y.o.  Admit date: 05/10/2014 Discharge date: 05-12-2014  Primary Diagnosis:   LEFT SHOULDER ROTATOR CUFF TEAR  Admission Diagnoses:  Past Medical History  Diagnosis Date  . Coronary artery disease   . RBBB (right bundle branch block)   . TIA (transient ischemic attack)     x 2 post surgery  . Hypertension   . Heart murmur   . Kidney stones   . High cholesterol   . Type 2 diabetes mellitus   . Chronic lower back pain    Discharge Diagnoses:   Active Problems:   S/P shoulder replacement  Procedure:  Procedure(s) (LRB): LEFT SHOULDER REVERSE  (Left)   Consults: None  HPI: Patient is a 74 year old male with a history of prior rotator cuff surgery and now profound shoulder dysfunction and pain. 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 X-rays performed demonstrating superior head migration. The patient presents now for operative treatment for his shoulder planning on reverse shoulder arthroplasty to restore fixed fulcrum mechanics and allow for better function and decrease pain. Risks and benefits were discussed. Informed consent obtained.  Laboratory Data: No results found for any previous visit.  X-Rays:Dg Shoulder Left Port  05/10/2014   CLINICAL DATA:  Left shoulder replacement.  EXAM: LEFT SHOULDER - 1 VIEW  COMPARISON:  None.  FINDINGS: Total left shoulder replacement. Anatomic alignment is noted on one view. Hardware appears intact.  IMPRESSION: Total left shoulder replacement.   Electronically Signed   By: Maisie Fushomas  Register   On: 05/10/2014 16:17    EKG:No orders found for this or any previous visit.   Hospital Course: [facility] and taken to the OR and underwent the above state procedure without complications.  Patient tolerated the procedure well and was later transferred to the recovery room and  then to the orthopaedic floor for postoperative care.  They were given PO and IV analgesics for pain control following their surgery.  They were given 24 hours of postoperative antibiotics.   PT was consulted postop to assist with mobility and transfers.  . Discharge planning was consulted to help with postop disposition and equipment needs.  Patient had a decent night on the evening of surgery except for some pain. They started to get up OOB with therapy on day one.  Patient was seen in rounds on day two.  Sitting up in bed and feeling some better.  They were given discharge instructions and dressing directions.  They were instructed on when to follow up in the office with Dr. Ranell PatrickNorris  Discharge home Diet - Cardiac diet and Diabetic diet Follow up - in 2 weeks with Dr. Ranell PatrickNorris Activity - up ad lib Disposition - Home Condition Upon Discharge - Stable D/C Meds - See DC Summary Sling for arm support    Medication List    STOP taking these medications        ibuprofen 200 MG tablet  Commonly known as:  ADVIL,MOTRIN     naproxen sodium 220 MG tablet  Commonly known as:  ANAPROX      TAKE these medications        acetaminophen 500 MG tablet  Commonly known as:  TYLENOL  Take 500 mg by mouth every 6 (six) hours as needed for mild pain.     aspirin 81 MG tablet  Take 81 mg by mouth daily.  clopidogrel 75 MG tablet  Commonly known as:  PLAVIX  Take 75 mg by mouth daily.     FISH OIL PO  Take 1 tablet by mouth daily.      furosemide 20 MG tablet  Commonly known as:  LASIX  Take 20 mg by mouth.     insulin glargine 100 UNIT/ML injection  Commonly known as:  LANTUS  Inject 50 Units into the skin at bedtime.     methocarbamol 500 MG tablet  Commonly known as:  ROBAXIN  Take 1 tablet (500 mg total) by mouth 3 (three) times daily as needed.     metoprolol tartrate 25 MG tablet  Commonly known as:  LOPRESSOR  Take 25 mg by mouth 2 (two) times daily.      oxyCODONE-acetaminophen 5-325 MG per tablet  Commonly known as:  ROXICET  Take 1-2 tablets by mouth every 4 (four) hours as needed for severe pain.     simvastatin 40 MG tablet  Commonly known as:  ZOCOR  Take 40 mg by mouth daily.       Follow-up Information    Follow up with NORRIS,STEVEN R, MD. Call in 2 weeks.   Specialty:  Orthopedic Surgery   Why:  [phone]   Contact information:   [address] [place] [address] [phone]       Signed: Avel Peace, PA-C Orthopaedic Surgery 05/16/2014, 8:56 AM

## 2015-10-14 ENCOUNTER — Other Ambulatory Visit: Payer: Self-pay | Admitting: Orthopedic Surgery

## 2015-10-14 DIAGNOSIS — M5442 Lumbago with sciatica, left side: Secondary | ICD-10-CM

## 2015-10-23 ENCOUNTER — Ambulatory Visit
Admission: RE | Admit: 2015-10-23 | Discharge: 2015-10-23 | Disposition: A | Discharge status: Home / Self Care | Payer: Medicare Other | Source: Ambulatory Visit | Attending: Orthopedic Surgery | Admitting: Orthopedic Surgery

## 2015-10-23 DIAGNOSIS — M5442 Lumbago with sciatica, left side: Secondary | ICD-10-CM

## 2015-10-23 MED ORDER — ONDANSETRON HCL 4 MG/2ML IJ SOLN: 4.0000 mg | Freq: Four times a day (QID) | INTRAMUSCULAR | Status: DC | PRN

## 2015-10-23 MED ORDER — IOPAMIDOL (ISOVUE-M 200) INJECTION 41%
18.0000 mL | Freq: Once | INTRAMUSCULAR | Status: AC
  Administered 2015-10-23: 18 mL via INTRATHECAL

## 2015-10-23 MED ORDER — DIAZEPAM 5 MG PO TABS
5.0000 mg | ORAL_TABLET | Freq: Once | ORAL | Status: AC
  Administered 2015-10-23: 5 mg via ORAL

## 2015-10-23 NOTE — Discharge Instructions (Signed)

## 2015-10-27 ENCOUNTER — Telehealth: Payer: Self-pay

## 2015-10-27 NOTE — Telephone Encounter (Signed)
Spoke with patient after his myelogram here 10/23/15.  He denies headache and states he is doing "okay."  jkl

## 2016-09-01 DEATH — deceased

## 2017-11-30 IMAGING — CT CT L SPINE W/ CM
1 of 7 series · 5 of 14 positions shown, 7 images · non-contrast
Comparison: None

CLINICAL DATA: Low back pain and left leg pain in weakness.
Multiple previous surgeries.
TECHNIQUE: Contiguous axial images were obtained through the Lumbar spine after
the intrathecal infusion of infusion. Coronal and sagittal
reconstructions were obtained of the axial image sets.

[Series 3: l spine soft · axial · 0.30mm/px · z∈[-310,-151]mm · 5 of 80 slices shown, 7 images]
[im 14/80  soft-tissue]
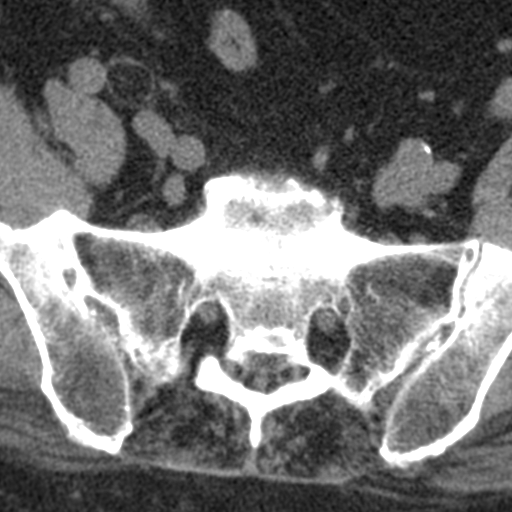
[im 14/80  bone]
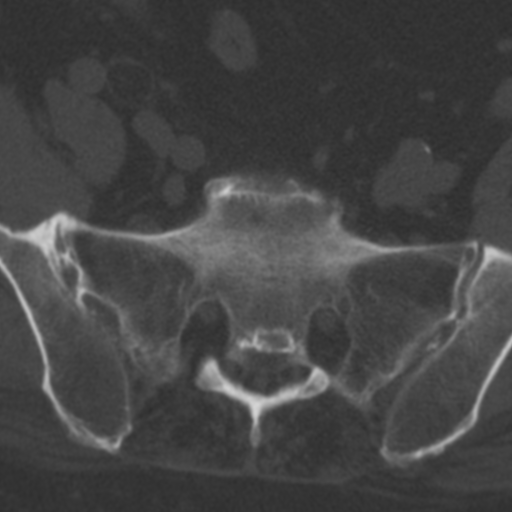
[im 27/80  bone]
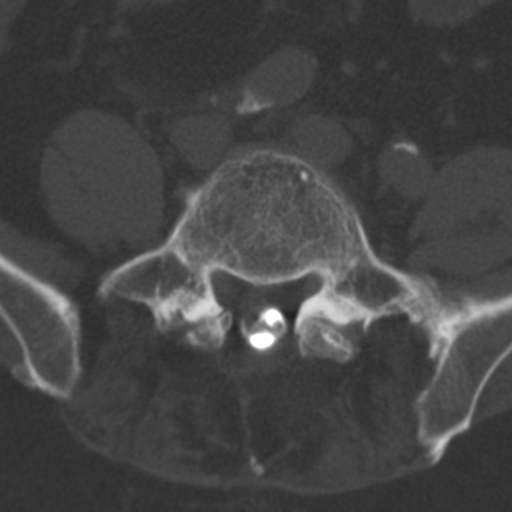
[im 40/80  bone]
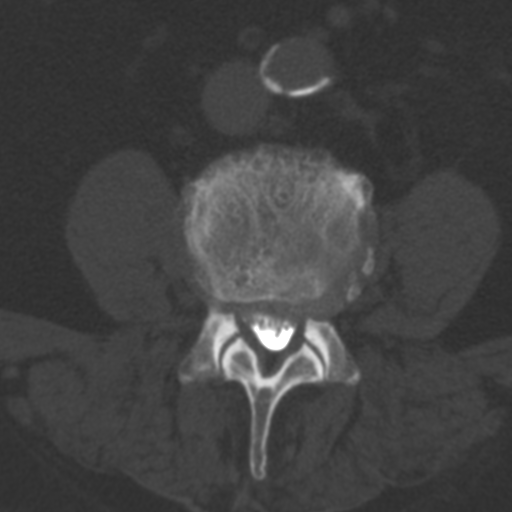
[im 53/80  bone]
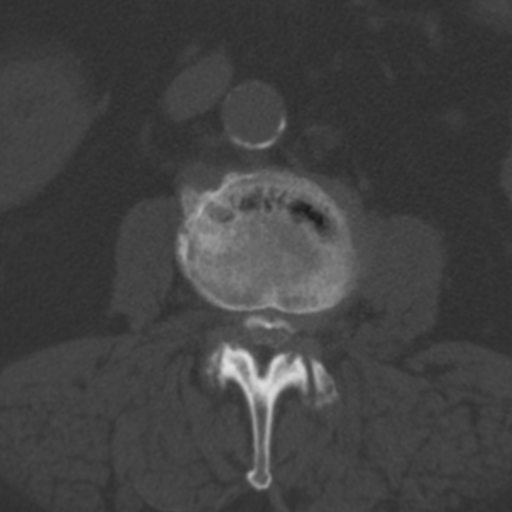
[im 66/80  soft-tissue]
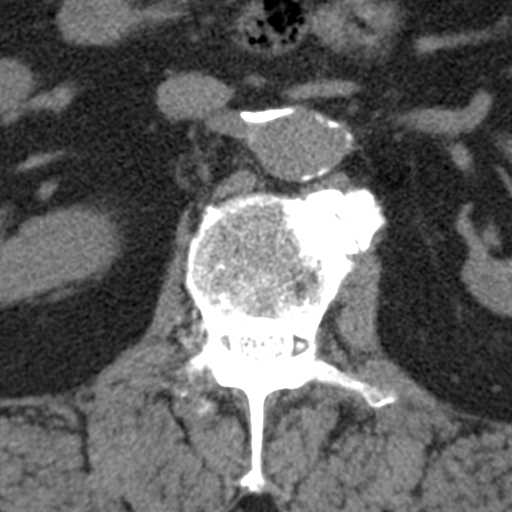
[im 66/80  bone]
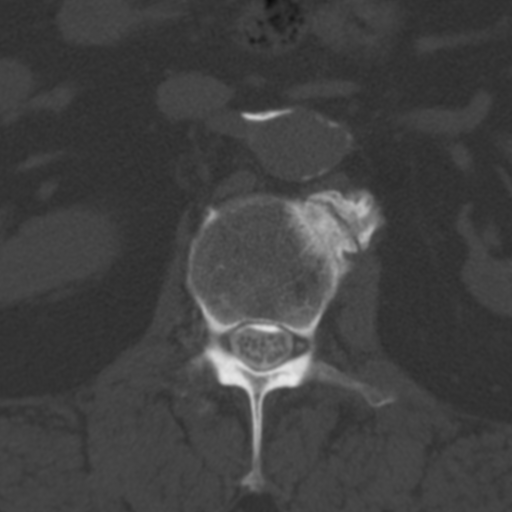

[5 of 14 positions shown; findings below may reference images not displayed]

EXAM:
LUMBAR MYELOGRAM

FLUOROSCOPY TIME:  2 minutes 38 seconds. 699.48 micro gray meter
squared

PROCEDURE:
After thorough discussion of risks and benefits of the procedure
including bleeding, infection, injury to nerves, blood vessels,
adjacent structures as well as headache and CSF leak, written and
oral informed consent was obtained. Consent was obtained by Dr. Reus
Mirqesem. Time out form was completed.

Patient was positioned prone on the fluoroscopy table. Local
anesthesia was provided with 1% lidocaine without epinephrine after
prepped and draped in the usual sterile fashion. Puncture was
performed initially at L5-S1 using a 5 inch 22-gauge spinal needle
via right para median approach. No CSF return could be seen at this
level despite positioning in the central canal. Therefore, puncture
was performed at the L2-3 level with success. Using a single pass
through the dura, the needle was placed within the thecal sac, with
return of clear CSF. 15 mL of Isovue 200 was injected into the
thecal sac, with normal opacification of the nerve roots and cauda
equina consistent with free flow within the subarachnoid space. The
examination does have a small mixed component in the epidural space.

I personally performed the lumbar puncture and administered the
intrathecal contrast. I also personally performed acquisition of the
myelogram images.
FINDINGS: LUMBAR MYELOGRAM FINDINGS:

There is severe spinal stenosis at the L2-3 level. There is
anterolisthesis of 4 mm that increases to 7 mm with standing
flexion. L3-4 shows retrolisthesis of 5 mm with standing. There is
mild narrowing of both lateral recesses. At L4-5, there is a
pronounced arachnoiditis pattern with contrast showing a very
irregular appearance and not extending down past mid L5.

CT LUMBAR MYELOGRAM FINDINGS:

T12-L1:  Normal interspace.

L1-2: Minimal bulging of the disc. No stenosis or neural
compression.

L2-3: Bilateral facet arthropathy with facet and ligamentous
hypertrophy. Anterolisthesis of 5 mm. Bulging of the disc. Severe
spinal stenosis with near effacement of the subarachnoid space and
crowding of the nerve roots. This appearance would worsen with
standing and flexion.

L3-4: Bulging of the disc more prominent towards the left.
Retrolisthesis of 3 mm. Mild facet hypertrophy. Mild stenosis of
both lateral recesses and neural foramina without definite neural
compression.

L4-5: Previous posterior decompression. Endplate osteophytes and
annular calcification. Disc calcification. Facet hypertrophy. No
central canal stenosis. Foraminal narrowing bilaterally of a
moderate degree. Abnormal nerve root distribution consistent with
arachnoiditis.

L5-S1: Small irregular thecal sac visible at CT consistent with
advanced arachnoiditis. Pronounced nerve root clumping. Shallow
broad-based disc protrusion more prominent towards the right. Facet
and ligamentous hypertrophy. Mild foraminal narrowing on the right
and the left. Narrowing of the subarticular lateral recess without
visible compression of the right S1 nerve root.

Solid bridging osteophytes of the right sacroiliac joint. Left
sacroiliac joint appears unremarkable.

There is advanced aortoiliac atherosclerosis. Simple cysts of the
right kidney incidentally noted.
IMPRESSION: Partially mixed contrast injection, not sufficient to hinder the
diagnostic nature of the study.

L2-3: Severe spinal stenosis. There is facet arthropathy and
ligamentous hypertrophy with anterolisthesis of 4-5 mm that
increases to 7 mm with standing. There is shallow protrusion of the
disc. Nerve root compression is likely at this level.

L3-4: Retrolisthesis of 3-4 mm. Bulging of the disc. Facet
hypertrophy. Mild narrowing of the lateral recesses and foramina
without definite neural compression.

L4-5: Previous posterior decompression. Arachnoiditis pattern,
severe. Endplate osteophytes and annular calcification with
narrowing of the lateral recesses and foramina that could be
symptomatic.

L5-S1: Markedly narrow and irregular subarachnoid space consistent
with advanced arachnoiditis. Pronounced nerve root clumping.
Broad-based herniation of the disc more prominent in the right
posterior lateral direction. Facet degeneration and hypertrophy.
Foraminal narrowing bilaterally but without definite compression of
the exiting L5 nerve roots. Subarticular lateral recess narrowing on
the right that could affect the right S1 nerve root.
